# Patient Record
Sex: Male | Born: 1970 | Hispanic: Yes | Marital: Married | Smoking: Never smoker
Health system: Southern US, Community
[De-identification: ages and names within clinical notes are randomized; demographics above are authoritative.]

---

## 2005-05-04 ENCOUNTER — Emergency Department: Payer: Self-pay | Admitting: Internal Medicine

## 2007-11-05 ENCOUNTER — Emergency Department: Payer: Self-pay | Admitting: Emergency Medicine

## 2014-11-22 ENCOUNTER — Emergency Department: Payer: Self-pay | Admitting: Emergency Medicine

## 2021-12-09 ENCOUNTER — Emergency Department
Admission: EM | Admit: 2021-12-09 | Discharge: 2021-12-09 | Disposition: A | Discharge status: Home / Self Care | Payer: No Typology Code available for payment source | Attending: Emergency Medicine | Admitting: Emergency Medicine

## 2021-12-09 ENCOUNTER — Encounter: Payer: Self-pay | Admitting: Emergency Medicine

## 2021-12-09 ENCOUNTER — Emergency Department: Payer: No Typology Code available for payment source

## 2021-12-09 DIAGNOSIS — H10213 Acute toxic conjunctivitis, bilateral: Secondary | ICD-10-CM | POA: Diagnosis not present

## 2021-12-09 DIAGNOSIS — S0031XA Abrasion of nose, initial encounter: Secondary | ICD-10-CM | POA: Insufficient documentation

## 2021-12-09 DIAGNOSIS — R519 Headache, unspecified: Secondary | ICD-10-CM | POA: Diagnosis not present

## 2021-12-09 DIAGNOSIS — Y92410 Unspecified street and highway as the place of occurrence of the external cause: Secondary | ICD-10-CM | POA: Insufficient documentation

## 2021-12-09 DIAGNOSIS — S0993XA Unspecified injury of face, initial encounter: Secondary | ICD-10-CM | POA: Diagnosis present

## 2021-12-09 DIAGNOSIS — T5994XA Toxic effect of unspecified gases, fumes and vapors, undetermined, initial encounter: Secondary | ICD-10-CM | POA: Insufficient documentation

## 2021-12-09 MED ORDER — FLUORESCEIN SODIUM 1 MG OP STRP
1.0000 | ORAL_STRIP | Freq: Once | OPHTHALMIC | Status: AC
Start: 2021-12-09 — End: 2021-12-09
  Administered 2021-12-09: 1 via OPHTHALMIC
  Filled 2021-12-09: qty 1

## 2021-12-09 MED ORDER — HYDROCODONE-ACETAMINOPHEN 5-325 MG PO TABS
1.0000 | ORAL_TABLET | Freq: Once | ORAL | Status: AC
  Administered 2021-12-09: 1 via ORAL
  Filled 2021-12-09: qty 1

## 2021-12-09 MED ORDER — HYDROCODONE-ACETAMINOPHEN 5-325 MG PO TABS
1.0000 | ORAL_TABLET | Freq: Four times a day (QID) | ORAL | 0 refills | Status: DC | PRN
Start: 2021-12-09 — End: 2021-12-26

## 2021-12-09 MED ORDER — NEOMYCIN-POLYMYXIN-DEXAMETH 3.5-10000-0.1 OP SUSP: 1.0000 [drp] | Freq: Four times a day (QID) | OPHTHALMIC | 0 refills | Status: AC

## 2021-12-09 MED ORDER — ERYTHROMYCIN 5 MG/GM OP OINT: TOPICAL_OINTMENT | OPHTHALMIC | 0 refills | Status: DC

## 2021-12-09 MED ORDER — ERYTHROMYCIN 5 MG/GM OP OINT
TOPICAL_OINTMENT | Freq: Once | OPHTHALMIC | Status: AC
  Administered 2021-12-09: 1 via OPHTHALMIC
  Filled 2021-12-09: qty 1

## 2021-12-09 MED ORDER — TETRACAINE HCL 0.5 % OP SOLN
1.0000 [drp] | Freq: Once | OPHTHALMIC | Status: AC
  Administered 2021-12-09: 2 [drp] via OPHTHALMIC
  Filled 2021-12-09: qty 4
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
## 2021-12-11 ENCOUNTER — Encounter: Payer: Self-pay | Admitting: Emergency Medicine

## 2021-12-26 ENCOUNTER — Emergency Department: Payer: No Typology Code available for payment source

## 2021-12-26 ENCOUNTER — Emergency Department
Admission: EM | Admit: 2021-12-26 | Discharge: 2021-12-26 | Disposition: A | Discharge status: Home / Self Care | Payer: No Typology Code available for payment source | Attending: Emergency Medicine | Admitting: Emergency Medicine

## 2021-12-26 ENCOUNTER — Encounter: Payer: Self-pay | Admitting: Emergency Medicine

## 2021-12-26 ENCOUNTER — Other Ambulatory Visit: Payer: Self-pay

## 2021-12-26 DIAGNOSIS — J020 Streptococcal pharyngitis: Secondary | ICD-10-CM | POA: Diagnosis not present

## 2021-12-26 DIAGNOSIS — Z20822 Contact with and (suspected) exposure to covid-19: Secondary | ICD-10-CM | POA: Insufficient documentation

## 2021-12-26 DIAGNOSIS — R000 Tachycardia, unspecified: Secondary | ICD-10-CM | POA: Diagnosis not present

## 2021-12-26 DIAGNOSIS — R519 Headache, unspecified: Secondary | ICD-10-CM | POA: Diagnosis present

## 2021-12-26 LAB — GROUP A STREP BY PCR: Group A Strep by PCR: DETECTED — AB

## 2021-12-26 LAB — RESP PANEL BY RT-PCR (FLU A&B, COVID) ARPGX2
Influenza A by PCR: NEGATIVE
Influenza B by PCR: NEGATIVE
SARS Coronavirus 2 by RT PCR: NEGATIVE

## 2021-12-26 MED ORDER — PENICILLIN G BENZATHINE 1200000 UNIT/2ML IM SUSY
1.2000 10*6.[IU] | PREFILLED_SYRINGE | Freq: Once | INTRAMUSCULAR | Status: AC
  Administered 2021-12-26: 1.2 10*6.[IU] via INTRAMUSCULAR
  Filled 2021-12-26: qty 2

## 2021-12-26 MED ORDER — ACETAMINOPHEN 500 MG PO TABS: 1000.0000 mg | ORAL_TABLET | Freq: Once | ORAL | Status: DC | PRN

## 2021-12-26 MED ORDER — ACETAMINOPHEN 325 MG PO TABS: 650.0000 mg | ORAL_TABLET | Freq: Once | ORAL | Status: DC | PRN

## 2021-12-26 MED ORDER — DEXAMETHASONE 1 MG/ML PO CONC
10.0000 mg | Freq: Once | ORAL | Status: AC
  Administered 2021-12-26: 10 mg via ORAL
  Filled 2021-12-26: qty 10

## 2021-12-26 NOTE — ED Notes (Signed)
See triage note  presents with body aches ,h/a and sore throat  states he developed sx's 2 days ago  febrile on arrival

## 2021-12-26 NOTE — ED Triage Notes (Signed)
Pt comes into the ED via POV c/o body aches, headache, and sore throat. Pt states the symptoms have been ongoing x 2 days.  Pt has even and unlabored respirations.

## 2021-12-26 NOTE — ED Provider Notes (Signed)
[facility] Provider Note    None    (approximate)   History   Chief Complaint Headache, Generalized Body Aches (/), and Sore Throat   HPI Brian Bentley is a 51 y.o. male, unremarkable medical history, presents to the emergency department for evaluation of flulike symptoms.  Patient endorses body aches, headache, and sore throat has been going on for the past 2 days.  Denies chest pain, shortness of breath, back pain, flank pain, nausea/vomiting, abdominal pain, or urinary symptoms.  History Limitations: Spanish-speaking.      Physical Exam  Triage Vital Signs: ED Triage Vitals  Enc Vitals Group     BP 12/26/21 1816 (!) 141/101     Pulse Rate 12/26/21 1816 (!) 116     Resp 12/26/21 1816 18     Temp 12/26/21 1816 (!) 100.9 F (38.3 C)     Temp Source 12/26/21 1816 Oral     SpO2 12/26/21 1816 95 %     Weight --      Height 12/26/21 1818 5\' 5"  (1.651 m)     Head Circumference --      Peak Flow --      Pain Score 12/26/21 1818 10     Pain Loc --      Pain Edu? --      Excl. in GC? --     Most recent vital signs: Vitals:   12/26/21 1816  BP: (!) 141/101  Pulse: (!) 116  Resp: 18  Temp: (!) 100.9 F (38.3 C)  SpO2: 95%    General: Awake, NAD.  CV: Good peripheral perfusion.  Resp: Normal effort.  Lung sounds clear bilaterally in the apices and bases. Abd: Soft, non-tender. No distention.  Neuro: At baseline. No gross neurological deficits. Other: Pharynx edematous and erythematous.  Tonsillar swelling appreciated.  No exudates.  Uvula midline.  No evidence of peritonsillar abscess.  Physical Exam    ED Results / Procedures / Treatments  Labs (all labs ordered are listed, but only abnormal results are displayed) Labs Reviewed  GROUP A STREP BY PCR - Abnormal; Notable for the following components:      Result Value   Group A Strep by PCR DETECTED (*)    All other components within normal limits  RESP PANEL BY RT-PCR  (FLU A&B, COVID) [id number]     EKG Not applicable.   RADIOLOGY  ED Provider Interpretation: I personally reviewed and interpreted this image.  No evidence of active cardiopulmonary disease.  DG Chest 2 View  Result Date: 12/26/2021 CLINICAL DATA:  Provided history: Suspect pneumonia. Additional history provided: Patient reports body aches, headache, sore throat, symptoms ongoing for 2 days. EXAM: CHEST - 2 VIEW COMPARISON:  Prior chest radiographs 11/05/2007. FINDINGS: Heart size within normal limits. No appreciable airspace consolidation. No evidence of pleural effusion or pneumothorax. No acute bony abnormality identified. Degenerative changes of the spine. IMPRESSION: No evidence of active cardiopulmonary disease. Electronically Signed   By: 14/10/2007 D.O.   On: 12/26/2021 19:14    PROCEDURES:  Critical Care performed: None.  Procedures    MEDICATIONS ORDERED IN ED: Medications  acetaminophen (TYLENOL) tablet 1,000 mg (has no administration in time range)  penicillin g benzathine (BICILLIN LA) 1200000 UNIT/2ML injection 1.2 Million Units (1.2 Million Units Intramuscular Given 12/26/21 1919)  dexamethasone (DECADRON) 1 MG/ML solution 10 mg (10 mg Oral Given 12/26/21 1955)     IMPRESSION / MDM / ASSESSMENT AND PLAN / ED COURSE  I reviewed the triage vital signs and the nursing notes.                              Brian Bentley is a 51 y.o. male, unremarkable medical history, presents to the emergency department for evaluation of flulike symptoms.  Patient endorses body aches, headache, and sore throat has been going on for the past 2 days.   Differential diagnosis includes, but is not limited to, influenza, COVID-19, strep pharyngitis, tonsillitis, bronchitis, pneumonia.  ED Course Patient is tachycardic at 116 and febrile at 100.9.  We will go ahead and treat with acetaminophen.  Group A strep PCR positive.  Respiratory panel negative for COVID-19 or  influenza.   Assessment/Plan History, physical exam, and work-up consistent with streptococcal pharyngitis.  We will go ahead and treat here with penicillin G and dexamethasone.  We will plan to discharge this patient.   Patient was provided with anticipatory guidance, return precautions, and educational material. Encouraged the patient to return to the emergency department at any time if they begin to experience any new or worsening symptoms.       FINAL CLINICAL IMPRESSION(S) / ED DIAGNOSES   Final diagnoses:  Strep pharyngitis     Rx / DC Orders   ED Discharge Orders     None        Note:  This document was prepared using Dragon voice recognition software and may include unintentional dictation errors.   Varney Daily, [place] 12/26/21 2000    Sharman Cheek, MD 12/27/21 Rickey Primus

## 2021-12-26 NOTE — Discharge Instructions (Addendum)
-  Take Tylenol/ibuprofen as needed for pain. -Return to the emergency department at any time if you begin to experience any new or worsening symptoms. -Follow-up with your primary care provider as needed.

## 2022-09-09 IMAGING — CR DG CHEST 2V
1 series · 2 of 2 positions shown · non-contrast
Comparison: Prior chest radiographs 11/05/2007.

CLINICAL DATA: Provided history: Suspect pneumonia. Additional
history provided: Patient reports body aches, headache, sore throat,
symptoms ongoing for 2 days.

EXAM:
CHEST - 2 VIEW

[Series 1: dg chest 2 view · 0.14mm/px · 2 of 2 slices shown]
[im 1/2]
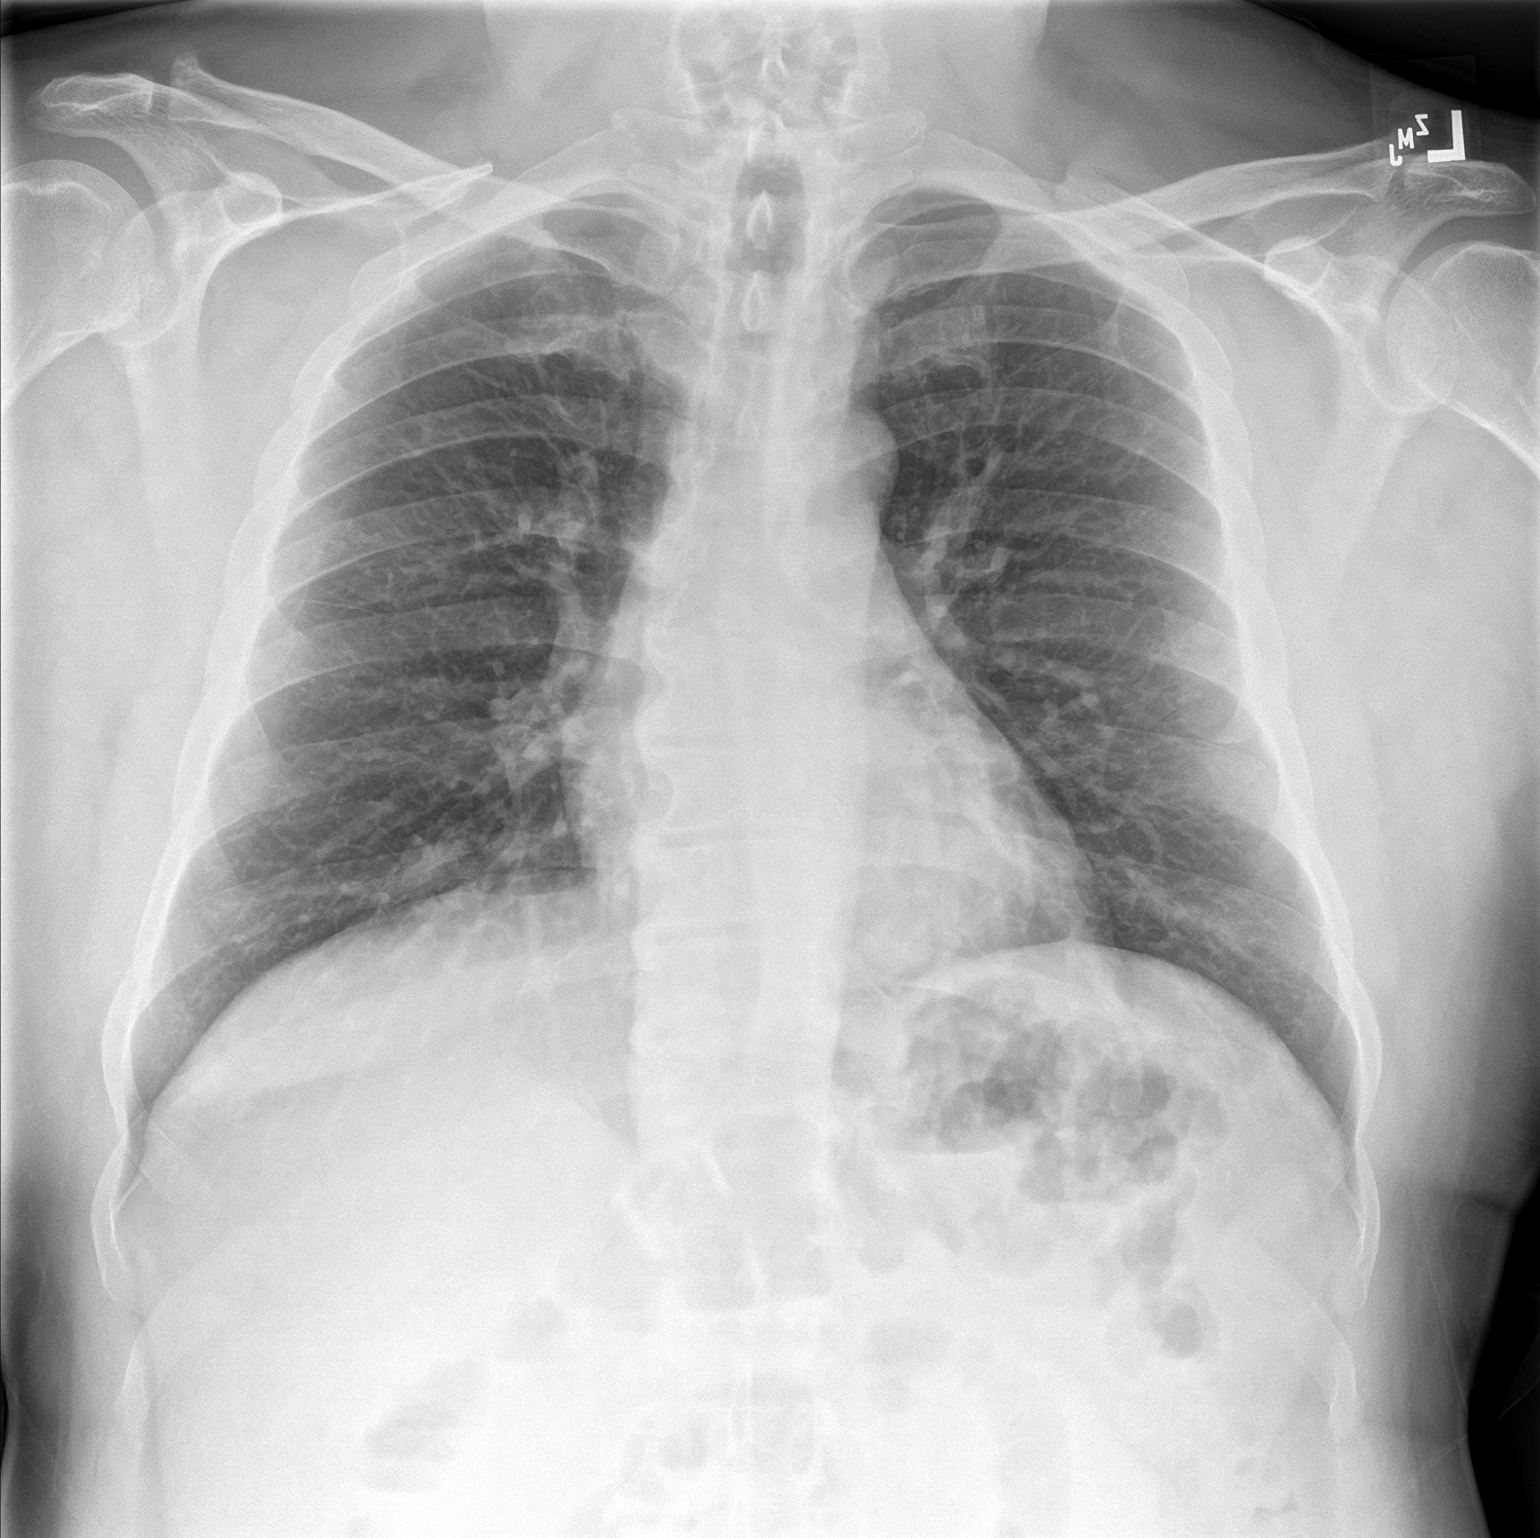
[im 2/2]
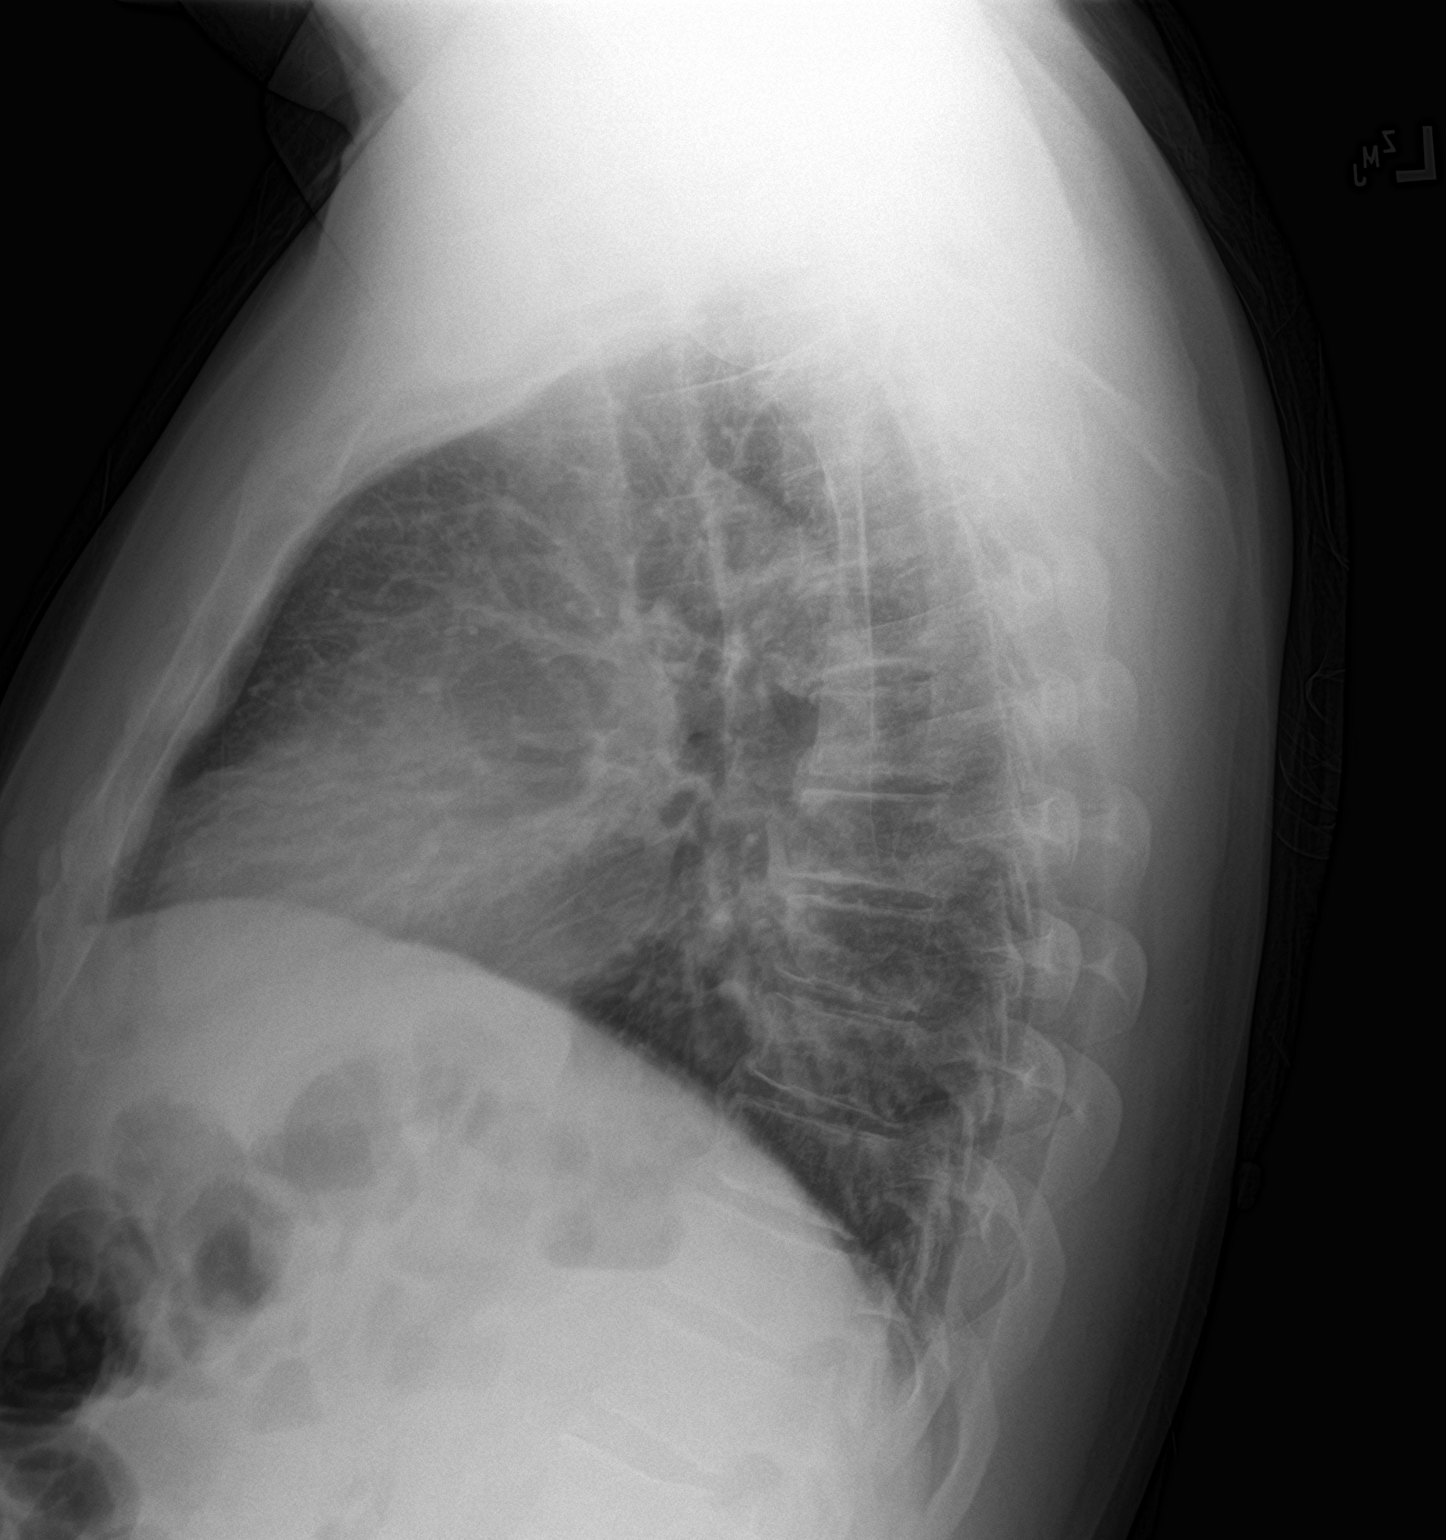

[2 of 2 positions shown; findings below may reference images not displayed]

FINDINGS: Heart size within normal limits. No appreciable airspace
consolidation. No evidence of pleural effusion or pneumothorax. No
acute bony abnormality identified. Degenerative changes of the
spine.
IMPRESSION: No evidence of active cardiopulmonary disease.
